# Patient Record
Sex: Male | Born: 1955 | Race: White | Hispanic: No | Marital: Married | Smoking: Current every day smoker
Health system: Southern US, Community
[De-identification: ages and names within clinical notes are randomized; demographics above are authoritative.]

## PROBLEM LIST (undated history)

## (undated) DIAGNOSIS — J449 Chronic obstructive pulmonary disease, unspecified: Secondary | ICD-10-CM

---

## 2020-09-12 ENCOUNTER — Emergency Department (HOSPITAL_COMMUNITY): Payer: BC Managed Care – PPO

## 2020-09-12 ENCOUNTER — Observation Stay (HOSPITAL_COMMUNITY)
Admission: EM | Admit: 2020-09-12 | Discharge: 2020-09-13 | Disposition: A | Discharge status: Home / Self Care | Payer: BC Managed Care – PPO | Attending: Surgery | Admitting: Surgery

## 2020-09-12 ENCOUNTER — Encounter (HOSPITAL_COMMUNITY): Payer: Self-pay

## 2020-09-12 ENCOUNTER — Other Ambulatory Visit: Payer: Self-pay

## 2020-09-12 DIAGNOSIS — Y92410 Unspecified street and highway as the place of occurrence of the external cause: Secondary | ICD-10-CM | POA: Insufficient documentation

## 2020-09-12 DIAGNOSIS — R079 Chest pain, unspecified: Secondary | ICD-10-CM | POA: Insufficient documentation

## 2020-09-12 DIAGNOSIS — R000 Tachycardia, unspecified: Secondary | ICD-10-CM | POA: Diagnosis not present

## 2020-09-12 DIAGNOSIS — Z20822 Contact with and (suspected) exposure to covid-19: Secondary | ICD-10-CM | POA: Insufficient documentation

## 2020-09-12 DIAGNOSIS — S2249XA Multiple fractures of ribs, unspecified side, initial encounter for closed fracture: Secondary | ICD-10-CM

## 2020-09-12 DIAGNOSIS — R109 Unspecified abdominal pain: Secondary | ICD-10-CM | POA: Insufficient documentation

## 2020-09-12 DIAGNOSIS — Z23 Encounter for immunization: Secondary | ICD-10-CM | POA: Insufficient documentation

## 2020-09-12 DIAGNOSIS — S2241XA Multiple fractures of ribs, right side, initial encounter for closed fracture: Secondary | ICD-10-CM | POA: Diagnosis not present

## 2020-09-12 DIAGNOSIS — F17200 Nicotine dependence, unspecified, uncomplicated: Secondary | ICD-10-CM | POA: Insufficient documentation

## 2020-09-12 DIAGNOSIS — S299XXA Unspecified injury of thorax, initial encounter: Secondary | ICD-10-CM | POA: Diagnosis present

## 2020-09-12 HISTORY — DX: Chronic obstructive pulmonary disease, unspecified: J44.9

## 2020-09-12 LAB — CBC WITH DIFFERENTIAL/PLATELET
Abs Immature Granulocytes: 0.26 10*3/uL — ABNORMAL HIGH (ref 0.00–0.07)
Basophils Absolute: 0.2 10*3/uL — ABNORMAL HIGH (ref 0.0–0.1)
Basophils Relative: 1 %
Eosinophils Absolute: 0.1 10*3/uL (ref 0.0–0.5)
Eosinophils Relative: 1 %
HCT: 49 % (ref 39.0–52.0)
Hemoglobin: 16.5 g/dL (ref 13.0–17.0)
Immature Granulocytes: 1 %
Lymphocytes Relative: 7 %
Lymphs Abs: 1.6 10*3/uL (ref 0.7–4.0)
MCH: 32.5 pg (ref 26.0–34.0)
MCHC: 33.7 g/dL (ref 30.0–36.0)
MCV: 96.5 fL (ref 80.0–100.0)
Monocytes Absolute: 1.6 10*3/uL — ABNORMAL HIGH (ref 0.1–1.0)
Monocytes Relative: 6 %
Neutro Abs: 20.9 10*3/uL — ABNORMAL HIGH (ref 1.7–7.7)
Neutrophils Relative %: 84 %
Platelets: 272 10*3/uL (ref 150–400)
RBC: 5.08 MIL/uL (ref 4.22–5.81)
RDW: 12.1 % (ref 11.5–15.5)
WBC: 24.7 10*3/uL — ABNORMAL HIGH (ref 4.0–10.5)
nRBC: 0 % (ref 0.0–0.2)

## 2020-09-12 LAB — COMPREHENSIVE METABOLIC PANEL
ALT: 22 U/L (ref 0–44)
AST: 29 U/L (ref 15–41)
Albumin: 3.7 g/dL (ref 3.5–5.0)
Alkaline Phosphatase: 62 U/L (ref 38–126)
Anion gap: 12 (ref 5–15)
BUN: 13 mg/dL (ref 8–23)
CO2: 22 mmol/L (ref 22–32)
Calcium: 8.6 mg/dL — ABNORMAL LOW (ref 8.9–10.3)
Chloride: 105 mmol/L (ref 98–111)
Creatinine, Ser: 0.84 mg/dL (ref 0.61–1.24)
GFR, Estimated: >60 mL/min (ref >60)
Glucose, Bld: 106 mg/dL — ABNORMAL HIGH (ref 70–99)
Potassium: 3.9 mmol/L (ref 3.5–5.1)
Sodium: 139 mmol/L (ref 135–145)
Total Bilirubin: 0.9 mg/dL (ref 0.3–1.2)
Total Protein: 7.8 g/dL (ref 6.5–8.1)

## 2020-09-12 LAB — PROTIME-INR
INR: 1 (ref 0.8–1.2)
Prothrombin Time: 13.4 seconds (ref 11.4–15.2)

## 2020-09-12 LAB — I-STAT CHEM 8, ED
BUN: 15 mg/dL (ref 8–23)
Calcium, Ion: 1.05 mmol/L — ABNORMAL LOW (ref 1.15–1.40)
Chloride: 104 mmol/L (ref 98–111)
Creatinine, Ser: 0.7 mg/dL (ref 0.61–1.24)
Glucose, Bld: 107 mg/dL — ABNORMAL HIGH (ref 70–99)
HCT: 47 % (ref 39.0–52.0)
Hemoglobin: 16 g/dL (ref 13.0–17.0)
Potassium: 4.1 mmol/L (ref 3.5–5.1)
Sodium: 140 mmol/L (ref 135–145)
TCO2: 26 mmol/L (ref 22–32)

## 2020-09-12 LAB — RESP PANEL BY RT-PCR (FLU A&B, COVID) ARPGX2
Influenza A by PCR: NEGATIVE
Influenza B by PCR: NEGATIVE
SARS Coronavirus 2 by RT PCR: NEGATIVE

## 2020-09-12 LAB — ETHANOL: Alcohol, Ethyl (B): <10 mg/dL (ref <10)

## 2020-09-12 LAB — SAMPLE TO BLOOD BANK

## 2020-09-12 MED ORDER — SODIUM CHLORIDE 0.9 % IV BOLUS
500.0000 mL | Freq: Once | INTRAVENOUS | Status: AC
  Administered 2020-09-12: 500 mL via INTRAVENOUS

## 2020-09-12 MED ORDER — IBUPROFEN 200 MG PO TABS
800.0000 mg | ORAL_TABLET | Freq: Three times a day (TID) | ORAL | Status: DC
  Administered 2020-09-12 – 2020-09-13 (×2): 800 mg via ORAL
  Filled 2020-09-12: qty 4
  Filled 2020-09-12: qty 1

## 2020-09-12 MED ORDER — ONDANSETRON HCL 4 MG/2ML IJ SOLN: 4.0000 mg | Freq: Four times a day (QID) | INTRAMUSCULAR | Status: DC | PRN

## 2020-09-12 MED ORDER — HYDROMORPHONE HCL 1 MG/ML IJ SOLN
0.5000 mg | INTRAMUSCULAR | Status: DC | PRN
  Administered 2020-09-12: 0.5 mg via INTRAVENOUS
  Filled 2020-09-12: qty 1

## 2020-09-12 MED ORDER — HYDRALAZINE HCL 20 MG/ML IJ SOLN: 10.0000 mg | INTRAMUSCULAR | Status: DC | PRN

## 2020-09-12 MED ORDER — FENTANYL CITRATE (PF) 100 MCG/2ML IJ SOLN
50.0000 ug | Freq: Once | INTRAMUSCULAR | Status: AC
  Administered 2020-09-12: 50 ug via INTRAVENOUS
  Filled 2020-09-12: qty 2

## 2020-09-12 MED ORDER — ACETAMINOPHEN 325 MG PO TABS
650.0000 mg | ORAL_TABLET | Freq: Four times a day (QID) | ORAL | Status: DC
  Administered 2020-09-12 – 2020-09-13 (×4): 650 mg via ORAL
  Filled 2020-09-12 (×4): qty 2

## 2020-09-12 MED ORDER — ENOXAPARIN SODIUM 30 MG/0.3ML IJ SOSY: 30.0000 mg | PREFILLED_SYRINGE | Freq: Two times a day (BID) | INTRAMUSCULAR | Status: DC

## 2020-09-12 MED ORDER — DOCUSATE SODIUM 100 MG PO CAPS
100.0000 mg | ORAL_CAPSULE | Freq: Two times a day (BID) | ORAL | Status: DC
  Administered 2020-09-12 – 2020-09-13 (×2): 100 mg via ORAL
  Filled 2020-09-12 (×2): qty 1

## 2020-09-12 MED ORDER — TETANUS-DIPHTH-ACELL PERTUSSIS 5-2.5-18.5 LF-MCG/0.5 IM SUSY
0.5000 mL | PREFILLED_SYRINGE | Freq: Once | INTRAMUSCULAR | Status: AC
  Administered 2020-09-12: 0.5 mL via INTRAMUSCULAR
  Filled 2020-09-12: qty 0.5

## 2020-09-12 MED ORDER — ASPIRIN 81 MG PO CHEW
81.0000 mg | CHEWABLE_TABLET | Freq: Every day | ORAL | Status: DC
  Administered 2020-09-13: 81 mg via ORAL
  Filled 2020-09-12: qty 1

## 2020-09-12 MED ORDER — OXYCODONE HCL 5 MG PO TABS
5.0000 mg | ORAL_TABLET | ORAL | Status: DC | PRN
  Administered 2020-09-13 (×3): 5 mg via ORAL
  Filled 2020-09-12 (×3): qty 1

## 2020-09-12 MED ORDER — METHOCARBAMOL 500 MG PO TABS
500.0000 mg | ORAL_TABLET | Freq: Four times a day (QID) | ORAL | Status: DC | PRN
Start: 2020-09-12 — End: 2020-09-13

## 2020-09-12 MED ORDER — PNEUMOCOCCAL VAC POLYVALENT 25 MCG/0.5ML IJ INJ
0.5000 mL | INJECTION | INTRAMUSCULAR | Status: AC
  Administered 2020-09-13: 0.5 mL via INTRAMUSCULAR
  Filled 2020-09-12: qty 0.5

## 2020-09-12 MED ORDER — GABAPENTIN 300 MG PO CAPS
300.0000 mg | ORAL_CAPSULE | Freq: Three times a day (TID) | ORAL | Status: DC
  Administered 2020-09-12 – 2020-09-13 (×2): 300 mg via ORAL
  Filled 2020-09-12 (×2): qty 1

## 2020-09-12 MED ORDER — IOHEXOL 350 MG/ML SOLN
100.0000 mL | Freq: Once | INTRAVENOUS | Status: AC | PRN
  Administered 2020-09-12: 100 mL via INTRAVENOUS

## 2020-09-12 MED ORDER — METOPROLOL TARTRATE 5 MG/5ML IV SOLN: 5.0000 mg | Freq: Four times a day (QID) | INTRAVENOUS | Status: DC | PRN

## 2020-09-12 MED ORDER — BISACODYL 10 MG RE SUPP: 10.0000 mg | Freq: Every day | RECTAL | Status: DC | PRN

## 2020-09-12 MED ORDER — ONDANSETRON 4 MG PO TBDP: 4.0000 mg | ORAL_TABLET | Freq: Four times a day (QID) | ORAL | Status: DC | PRN
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
## 2020-09-12 NOTE — ED Notes (Addendum)
Pt came back from Ct and was found sitting on the end of the bed, stated that he feels sob when lying down. Pt was diaphoretic with labored breathing pattern. MD and PA notified.
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
## 2020-09-13 ENCOUNTER — Observation Stay (HOSPITAL_COMMUNITY): Payer: BC Managed Care – PPO

## 2020-09-13 ENCOUNTER — Other Ambulatory Visit (HOSPITAL_COMMUNITY): Payer: Self-pay

## 2020-09-13 DIAGNOSIS — S2241XA Multiple fractures of ribs, right side, initial encounter for closed fracture: Secondary | ICD-10-CM | POA: Diagnosis not present

## 2020-09-13 LAB — BASIC METABOLIC PANEL
Anion gap: 8 (ref 5–15)
BUN: 15 mg/dL (ref 8–23)
CO2: 26 mmol/L (ref 22–32)
Calcium: 8.1 mg/dL — ABNORMAL LOW (ref 8.9–10.3)
Chloride: 101 mmol/L (ref 98–111)
Creatinine, Ser: 0.9 mg/dL (ref 0.61–1.24)
GFR, Estimated: >60 mL/min (ref >60)
Glucose, Bld: 107 mg/dL — ABNORMAL HIGH (ref 70–99)
Potassium: 4.1 mmol/L (ref 3.5–5.1)
Sodium: 135 mmol/L (ref 135–145)

## 2020-09-13 LAB — CBC
HCT: 43.1 % (ref 39.0–52.0)
Hemoglobin: 14.4 g/dL (ref 13.0–17.0)
MCH: 32.5 pg (ref 26.0–34.0)
MCHC: 33.4 g/dL (ref 30.0–36.0)
MCV: 97.3 fL (ref 80.0–100.0)
Platelets: 241 10*3/uL (ref 150–400)
RBC: 4.43 MIL/uL (ref 4.22–5.81)
RDW: 12.4 % (ref 11.5–15.5)
WBC: 13.7 10*3/uL — ABNORMAL HIGH (ref 4.0–10.5)
nRBC: 0 % (ref 0.0–0.2)

## 2020-09-13 LAB — MRSA PCR SCREENING: MRSA by PCR: NEGATIVE

## 2020-09-13 LAB — HIV ANTIBODY (ROUTINE TESTING W REFLEX): HIV Screen 4th Generation wRfx: NONREACTIVE

## 2020-09-13 MED ORDER — IBUPROFEN 400 MG PO TABS
400.0000 mg | ORAL_TABLET | Freq: Three times a day (TID) | ORAL | 0 refills | Status: AC | PRN
  Filled 2020-09-13: qty 30, 5d supply, fill #0

## 2020-09-13 MED ORDER — METHOCARBAMOL 500 MG PO TABS
500.0000 mg | ORAL_TABLET | Freq: Four times a day (QID) | ORAL | 0 refills | Status: AC | PRN
  Filled 2020-09-13: qty 30, 8d supply, fill #0

## 2020-09-13 MED ORDER — OXYCODONE HCL 5 MG PO TABS
5.0000 mg | ORAL_TABLET | Freq: Four times a day (QID) | ORAL | 0 refills | Status: AC | PRN
  Filled 2020-09-13: qty 25, 7d supply, fill #0

## 2020-09-13 MED ORDER — DOCUSATE SODIUM 100 MG PO CAPS
100.0000 mg | ORAL_CAPSULE | Freq: Two times a day (BID) | ORAL | 0 refills | Status: AC
  Filled 2020-09-13: qty 10, 5d supply, fill #0

## 2020-09-13 MED ORDER — ALBUTEROL SULFATE (2.5 MG/3ML) 0.083% IN NEBU
2.5000 mg | INHALATION_SOLUTION | Freq: Four times a day (QID) | RESPIRATORY_TRACT | Status: DC
  Administered 2020-09-13: 2.5 mg via RESPIRATORY_TRACT
  Filled 2020-09-13: qty 3

## 2020-09-13 MED ORDER — ACETAMINOPHEN 325 MG PO TABS: 650.0000 mg | ORAL_TABLET | Freq: Four times a day (QID) | ORAL | Status: AC

## 2020-09-13 MED ORDER — ASPIRIN 81 MG PO CHEW: 81.0000 mg | CHEWABLE_TABLET | Freq: Every day | ORAL | Status: AC
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
## 2020-09-13 NOTE — Progress Notes (Signed)
Pt with discharge orders. Discharge paperwork reviewed with pt and all questions answered. IV removed. New medications delivered to pts room via [facility].
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
## 2020-09-13 NOTE — Discharge Instructions (Signed)
Rib Fracture  A rib fracture is a break or crack in one of the bones of the ribs. The ribs are like a cage that goes around your upper chest. A broken or cracked rib is often painful, but most do not cause other problems. Most rib fractures usually heal on their own in 1-3 months. What are the causes?  Doing movements over and over again with a lot of force, such as pitching a baseball or having a very bad cough.  A direct hit to the chest.  Cancer that has spread to the bones. What are the signs or symptoms?  Pain when you breathe in or cough.  Pain when someone presses on the injured area.  Feeling short of breath. How is this treated? Treatment depends on how bad the fracture is. In general:  Most rib fractures usually heal on their own in 1-3 months.  Healing may take longer if you have a cough or are doing activities that make the injury worse.  While you heal, you may be given medicines to control pain.  You will also be taught deep breathing exercises.  Very bad injuries may require a stay at the hospital or surgery. Follow these instructions at home: Managing pain, stiffness, and swelling  If told, put ice on the injured area. To do this: ? Put ice in a plastic bag. ? Place a towel between your skin and the bag. ? Leave the ice on for 20 minutes, 2-3 times a day. ? Take off the ice if your skin turns bright red. This is very important. If you cannot feel pain, heat, or cold, you have a greater risk of damage to the area.  Take over-the-counter and prescription medicines only as told by your doctor. Activity  Avoid activities that cause pain to the injured area. Protect your injured area.  Slowly increase activity as told by your doctor. General instructions  Do deep breathing exercises as told by your doctor. You may be told to: ? Take deep breaths many times a day. ? Cough several times a day while hugging a pillow. ? Use a device (incentive spirometer) to do  deep breathing many times a day.  Drink enough fluid to keep your pee (urine) clear or pale yellow.  Do not wear a rib belt or binder.  Keep all follow-up visits. Contact a doctor if:  You have a fever. Get help right away if:  You have trouble breathing.  You are short of breath.  You cannot stop coughing.  You cough up thick or bloody spit.  You feel like you may vomit (nauseous), vomit, or have belly (abdominal) pain.  Your pain gets worse and medicine does not help. These symptoms may be an emergency. Get help right away. Call your local emergency services (911 in the [place]  Do not wait to see if the symptoms will go away.  Do not drive yourself to the hospital. Summary  A rib fracture is a break or crack in one of the bones of the ribs.  Apply ice to the injured area and take medicines for pain as told by your doctor.  Take deep breaths and cough several times a day. Hug a pillow every time you cough. This information is not intended to replace advice given to you by your health care provider. Make sure you discuss any questions you have with your health care provider. Document Revised: 07/24/2019 Document Reviewed: 07/24/2019 [employer] Patient Education  2021 [employer]  

## 2020-09-13 NOTE — Progress Notes (Addendum)
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 deformity and degenerative changes of the thoracic spine, old healed rib fractures on the left, 5 cm left renal cyst, smaller hypodensity within the midportion of the right kidney, diverticulosis, 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 Will also need ENT follow up for incidental parotid glad finding.    LOS: 0 days    Hosie Spangle, [facility] [place] [facility] Please see Amion for pager number during day hours 7:00am-4:30pm
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
## 2020-09-13 NOTE — TOC CAGE-AID Note (Signed)
Transition of Care [facility]) - CAGE-AID Screening   Patient Details  Name: Devin Larsen MRN: [id number] Date of Birth: 05-21-1955  Clinical Narrative:  Patient states he does drink "several beers" but doesn't have any need for substance abuse resources at this time. He will D/C home today and has no additional needs at this time.  CAGE-AID Screening:    Have You Ever Felt You Ought to Cut Down on Your Drinking or Drug Use?: No Have People Annoyed You By Critizing Your Drinking Or Drug Use?: No Have You Felt Bad Or Guilty About Your Drinking Or Drug Use?: No Have You Ever Had a Drink or Used Drugs First Thing In The Morning to Steady Your Nerves or to Get Rid of a Hangover?: No CAGE-AID Score: 0  Substance Abuse Education Offered: Yes
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
